# Patient Record
Sex: Male | Born: 2000 | Hispanic: Yes | Marital: Single | Smoking: Never smoker
Health system: Southern US, Community
[De-identification: ages and names within clinical notes are randomized; demographics above are authoritative.]

---
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
## 2020-07-07 DIAGNOSIS — S00251A Superficial foreign body of right eyelid and periocular area, initial encounter: Secondary | ICD-10-CM | POA: Diagnosis not present

## 2021-03-27 ENCOUNTER — Other Ambulatory Visit: Payer: Self-pay | Admitting: Physician Assistant

## 2021-10-31 ENCOUNTER — Other Ambulatory Visit: Payer: Self-pay

## 2021-10-31 NOTE — Telephone Encounter (Signed)
Entered in error

## 2024-03-19 ENCOUNTER — Encounter: Payer: Self-pay | Admitting: Nurse Practitioner

## 2024-03-19 ENCOUNTER — Ambulatory Visit: Admitting: Nurse Practitioner

## 2024-03-19 ENCOUNTER — Ambulatory Visit: Payer: Self-pay | Admitting: Nurse Practitioner

## 2024-03-19 VITALS — BP 130/76 | HR 81 | Temp 97.5°F | Resp 16 | Ht 66.0 in | Wt 146.2 lb

## 2024-03-19 DIAGNOSIS — Z833 Family history of diabetes mellitus: Secondary | ICD-10-CM | POA: Diagnosis not present

## 2024-03-19 DIAGNOSIS — R040 Epistaxis: Secondary | ICD-10-CM | POA: Diagnosis not present

## 2024-03-19 DIAGNOSIS — E782 Mixed hyperlipidemia: Secondary | ICD-10-CM | POA: Diagnosis not present

## 2024-03-19 DIAGNOSIS — R55 Syncope and collapse: Secondary | ICD-10-CM

## 2024-03-19 DIAGNOSIS — Z113 Encounter for screening for infections with a predominantly sexual mode of transmission: Secondary | ICD-10-CM

## 2024-03-19 NOTE — Progress Notes (Signed)
 [facility] [address] [place], [place] [address]  Internal MEDICINE  Office Visit Note  Patient Name: Curtis Kent  [id number]  [id number]  Date of Service: 03/19/2024   Complaints/HPI Pt is here for establishment of PCP. Chief Complaint  Patient presents with   New Patient (Initial Visit)    Est care, nose bleeds    HPI Curtis Kent presents for a new patient visit to establish care.  Well-appearing 23 y.o. male with no significant medical problems.  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 He has had 1 nosebleed each week for the past 2 weeks. Reports that the bleeding is heavy for a couple of minutes and he feels lightheaded when this happens. Reports that he did have a concussion about 15 years ago as a child. Happens only from the right nare Possible syncopal episodes, confusion, has happened twice.  Family history of diabetes -- father and uncle.    Current Medication: No outpatient encounter medications on file as of 03/19/2024.   No facility-administered encounter medications on file as of 03/19/2024.    Surgical History: History reviewed. No pertinent surgical history.  Medical History: History reviewed. No pertinent past medical history.  Family History: History reviewed. No pertinent family history.  Social History   Socioeconomic History   Marital status: Single    Spouse name: Not on file   Number of children: Not on file   Years of education: Not on file   Highest education level: Not on file  Occupational History   Not on file  Tobacco Use   Smoking status: Some Days    Types: Cigarettes   Smokeless tobacco: Never  Substance and  Sexual Activity   Alcohol use: Yes    Comment: occ.   Drug use: Yes    Types: Marijuana   Sexual activity: Yes  Other Topics Concern   Not on file  Social History Narrative   Not on file   Social Drivers of Health   Financial Resource Strain: Not on file  Food Insecurity: Not on file  Transportation Needs: Not on file  Physical Activity: Not on file  Stress: Not on file  Social Connections: Not on file  Intimate Partner Violence: Not on file     Review of Systems  Constitutional:  Negative for chills, fatigue and unexpected weight change.  HENT:  Positive for nosebleeds. Negative for congestion, postnasal drip, rhinorrhea, sneezing and sore throat.   Eyes:  Negative for redness.  Respiratory: Negative.  Negative for cough, chest tightness, shortness of breath and wheezing.   Cardiovascular: Negative.  Negative for chest pain and palpitations.  Gastrointestinal: Negative.  Negative for abdominal pain, constipation, diarrhea, nausea and vomiting.  Genitourinary:  Negative for dysuria and frequency.  Musculoskeletal:  Negative for arthralgias, back pain, joint swelling and neck pain.  Skin:  Negative for rash.  Neurological:  Positive for syncope (presycope? x2 episodes). Negative for tremors and numbness.  Hematological:  Negative for adenopathy. Does not bruise/bleed easily.  Psychiatric/Behavioral:  Negative for behavioral problems (Depression), sleep disturbance and suicidal ideas. The patient is not nervous/anxious.     Vital Signs: BP 130/76   Pulse  81   Temp (!) 97.5 F (36.4 C)   Resp 16   Ht 5' 6 (1.676 m)   Wt 146 lb 3.2 oz (66.3 kg)   SpO2 99%   BMI 23.60 kg/m    Physical Exam Vitals reviewed.  Constitutional:      General: He is not in acute distress.    Appearance: Normal appearance. He is normal weight. He is not ill-appearing.  HENT:     Head: Normocephalic and atraumatic.  Eyes:     Pupils: Pupils are equal, round, and reactive to light.   Cardiovascular:     Rate and Rhythm: Normal rate and regular rhythm.  Pulmonary:     Effort: Pulmonary effort is normal. No respiratory distress.  Skin:    General: Skin is warm and dry.  Neurological:     Mental Status: He is alert and oriented to person, place, and time.  Psychiatric:        Mood and Affect: Mood normal.        Behavior: Behavior normal.       Assessment/Plan: 1. Recurrent epistaxis (Primary) Referred to ENT - Ambulatory referral to ENT  2. Pre-syncope Routine labs ordered  - CBC with Differential/Platelet - CMP14+EGFR - Lipid Profile - Hgb A1C w/o eAG  3. Mixed hyperlipidemia Routine labs ordered  - CBC with Differential/Platelet - CMP14+EGFR - Lipid Profile - Hgb A1C w/o eAG  4. Family history of type 2 diabetes mellitus Routine labs ordered  - CBC with Differential/Platelet - CMP14+EGFR - Lipid Profile - Hgb A1C w/o eAG  5. Screening for STD (sexually transmitted disease) Labs ordered  - STI Profile - Interpretation:    General Counseling: Curtis Kent verbalizes understanding of the findings of todays visit and agrees with plan of treatment. I have discussed any further diagnostic evaluation that may be needed or ordered today. We also reviewed his medications today. he has been encouraged to call the office with any questions or concerns that should arise related to todays visit.    Orders Placed This Encounter  Procedures   CBC with Differential/Platelet   CMP14+EGFR   Lipid Profile   Hgb A1C w/o eAG   STI Profile   Interpretation:   Ambulatory referral to ENT    No orders of the defined types were placed in this encounter.   Return for CPE, Suraiya Dickerson PCP at earliest available opening, please have labs done before next visit. .  Time spent:30 Minutes Time spent with patient included reviewing progress notes, labs, imaging studies, and discussing plan for follow up.   [place] Controlled Substance Database was reviewed by me for overdose  risk score (ORS)   This patient was seen by Mardy Maxin, FNP-C in collaboration with Dr. Sigrid Bathe as a part of collaborative care agreement.   Willi Borowiak R. Maxin, MSN, FNP-C Internal Medicine

## 2024-03-23 ENCOUNTER — Telehealth: Payer: Self-pay | Admitting: Nurse Practitioner

## 2024-03-23 NOTE — Telephone Encounter (Signed)
 Awaiting 03/19/24 office notes for Otolaryngology referral-Curtis Kent

## 2024-03-24 LAB — CBC WITH DIFFERENTIAL/PLATELET
Basophils Absolute: 0.1 x10E3/uL (ref 0.0–0.2)
Basos: 1 %
EOS (ABSOLUTE): 0.2 x10E3/uL (ref 0.0–0.4)
Eos: 3 %
Hematocrit: 47.1 % (ref 37.5–51.0)
Hemoglobin: 16.2 g/dL (ref 13.0–17.7)
Immature Grans (Abs): 0 x10E3/uL (ref 0.0–0.1)
Immature Granulocytes: 0 %
Lymphocytes Absolute: 1.9 x10E3/uL (ref 0.7–3.1)
Lymphs: 30 %
MCH: 31 pg (ref 26.6–33.0)
MCHC: 34.4 g/dL (ref 31.5–35.7)
MCV: 90 fL (ref 79–97)
Monocytes Absolute: 0.4 x10E3/uL (ref 0.1–0.9)
Monocytes: 6 %
Neutrophils Absolute: 3.8 x10E3/uL (ref 1.4–7.0)
Neutrophils: 60 %
Platelets: 313 x10E3/uL (ref 150–450)
RBC: 5.22 x10E6/uL (ref 4.14–5.80)
RDW: 12 % (ref 11.6–15.4)
WBC: 6.3 x10E3/uL (ref 3.4–10.8)

## 2024-03-24 LAB — LIPID PANEL
Chol/HDL Ratio: 2.9 ratio (ref 0.0–5.0)
Cholesterol, Total: 184 mg/dL (ref 100–199)
HDL: 63 mg/dL (ref >39)
LDL Chol Calc (NIH): 102 mg/dL — ABNORMAL HIGH (ref 0–99)
Triglycerides: 105 mg/dL (ref 0–149)
VLDL Cholesterol Cal: 19 mg/dL (ref 5–40)

## 2024-03-24 LAB — CMP14+EGFR
ALT: 14 IU/L (ref 0–44)
AST: 21 IU/L (ref 0–40)
Albumin: 4.9 g/dL (ref 4.3–5.2)
Alkaline Phosphatase: 99 IU/L (ref 47–123)
BUN/Creatinine Ratio: 15 (ref 9–20)
BUN: 12 mg/dL (ref 6–20)
Bilirubin Total: 0.6 mg/dL (ref 0.0–1.2)
CO2: 24 mmol/L (ref 20–29)
Calcium: 9.6 mg/dL (ref 8.7–10.2)
Chloride: 101 mmol/L (ref 96–106)
Creatinine, Ser: 0.82 mg/dL (ref 0.76–1.27)
Globulin, Total: 2.9 g/dL (ref 1.5–4.5)
Glucose: 86 mg/dL (ref 70–99)
Potassium: 4.5 mmol/L (ref 3.5–5.2)
Sodium: 139 mmol/L (ref 134–144)
Total Protein: 7.8 g/dL (ref 6.0–8.5)
eGFR: 127 mL/min/1.73 (ref >59)

## 2024-03-24 LAB — STI PROFILE
HCV Ab: NONREACTIVE
HIV Screen 4th Generation wRfx: NONREACTIVE
Hep B Core Total Ab: NEGATIVE
Hep B Surface Ab, Qual: NONREACTIVE
Hepatitis B Surface Ag: NEGATIVE
RPR Ser Ql: NONREACTIVE

## 2024-03-24 LAB — HGB A1C W/O EAG: Hgb A1c MFr Bld: 5.2 % (ref 4.8–5.6)

## 2024-03-24 LAB — HCV INTERPRETATION

## 2024-03-31 ENCOUNTER — Ambulatory Visit: Admitting: Nurse Practitioner

## 2024-03-31 ENCOUNTER — Ambulatory Visit: Payer: Self-pay | Admitting: Nurse Practitioner

## 2024-03-31 ENCOUNTER — Encounter: Payer: Self-pay | Admitting: Nurse Practitioner

## 2024-03-31 VITALS — BP 120/84 | HR 90 | Temp 97.5°F | Resp 16 | Ht 66.0 in | Wt 148.2 lb

## 2024-03-31 DIAGNOSIS — E7800 Pure hypercholesterolemia, unspecified: Secondary | ICD-10-CM | POA: Diagnosis not present

## 2024-03-31 DIAGNOSIS — Z0001 Encounter for general adult medical examination with abnormal findings: Secondary | ICD-10-CM

## 2024-03-31 DIAGNOSIS — R040 Epistaxis: Secondary | ICD-10-CM | POA: Diagnosis not present

## 2024-03-31 NOTE — Progress Notes (Signed)
 [facility] [address] [place], [place] [address]  Internal MEDICINE  Office Visit Note  Patient Name: Curtis Kent  [id number]  [id number]  Date of Service: 03/31/2024  Chief Complaint  Patient presents with   Annual Exam    HPI Curtis Kent presents for an annual well visit and physical exam.  Well-appearing 23 y.o. male with no significant medical problems and is not currently on any prescription medications  Labs: routine labs done, results discussed today.  New or worsening pain: none  Other concerns: none  Slightly elevated LDL 102, the rest of the cholesterol panel is normal.  CBC, CMP and A1c are normal  STD lab was negative -- negative for HIV, hep B, hep C and syphilis. His Hep B surface antibody is nonreactive which means he is also not immune to hepatitis B virus.    Current Medication: No outpatient encounter medications on file as of 03/31/2024.   No facility-administered encounter medications on file as of 03/31/2024.    Surgical History: History reviewed. No pertinent surgical history.  Medical History: History reviewed. No pertinent past medical history.  Family History: History reviewed. No pertinent family history.  Social History   Socioeconomic History   Marital status: Single    Spouse name: Not on file   Number of children: Not on file   Years of education: Not on file   Highest education level: Not on file  Occupational History   Not on file  Tobacco Use   Smoking status: Some Days    Types: Cigarettes   Smokeless tobacco: Never  Substance and Sexual Activity   Alcohol use: Yes    Comment: occ.   Drug use: Yes    Types: Marijuana   Sexual activity: Yes  Other Topics Concern   Not on file  Social History Narrative   Not on file   Social Drivers of Health   Financial Resource Strain: Not on file  Food Insecurity: Not on file  Transportation Needs: Not on file  Physical Activity: Not on file  Stress: Not on file   Social Connections: Not on file  Intimate Partner Violence: Not on file      Review of Systems  Constitutional:  Negative for chills, fatigue and unexpected weight change.  HENT:  Positive for nosebleeds. Negative for congestion, postnasal drip, rhinorrhea, sinus pressure, sinus pain, sneezing and sore throat.   Eyes:  Negative for redness.  Respiratory: Negative.  Negative for cough, chest tightness, shortness of breath and wheezing.   Cardiovascular: Negative.  Negative for chest pain and palpitations.  Gastrointestinal:  Negative for abdominal pain, constipation, diarrhea, nausea and vomiting.  Genitourinary:  Negative for dysuria and frequency.  Musculoskeletal:  Negative for arthralgias, back pain, joint swelling and neck pain.  Skin:  Negative for rash.  Neurological: Negative.  Negative for tremors and numbness.  Hematological:  Negative for adenopathy. Does not bruise/bleed easily.  Psychiatric/Behavioral:  Negative for behavioral problems (Depression), sleep disturbance and suicidal ideas. The patient is not nervous/anxious.     Vital Signs: BP 120/84   Pulse 90   Temp (!) 97.5 F (36.4 C)   Resp 16   Ht 5' 6 (1.676 m)   Wt 148 lb 3.2 oz (67.2 kg)   SpO2 99%   BMI 23.92 kg/m    Physical Exam Vitals reviewed.  Constitutional:      General: He is not in acute distress.    Appearance: Normal appearance. He is well-developed and normal weight.  He is not ill-appearing or diaphoretic.  HENT:     Head: Normocephalic and atraumatic.     Right Ear: Tympanic membrane, ear canal and external ear normal.     Left Ear: Tympanic membrane, ear canal and external ear normal.     Nose: No congestion or rhinorrhea.     Right Nostril: Epistaxis present.     Right Turbinates: Enlarged and swollen.     Mouth/Throat:     Mouth: Mucous membranes are moist.     Pharynx: Oropharynx is clear. No oropharyngeal exudate or posterior oropharyngeal erythema.  Eyes:     Extraocular  Movements: Extraocular movements intact.     Conjunctiva/sclera: Conjunctivae normal.     Pupils: Pupils are equal, round, and reactive to light.  Neck:     Thyroid: No thyromegaly.     Vascular: No JVD.     Trachea: No tracheal deviation.  Cardiovascular:     Rate and Rhythm: Normal rate and regular rhythm.     Pulses: Normal pulses.     Heart sounds: Normal heart sounds. No murmur heard.    No friction rub. No gallop.  Pulmonary:     Effort: Pulmonary effort is normal. No respiratory distress.     Breath sounds: Normal breath sounds. No wheezing or rales.  Chest:     Chest wall: No tenderness.  Abdominal:     General: Bowel sounds are normal.     Palpations: Abdomen is soft.  Musculoskeletal:        General: Normal range of motion.     Cervical back: Normal range of motion and neck supple.     Right lower leg: No edema.     Left lower leg: No edema.  Lymphadenopathy:     Cervical: No cervical adenopathy.  Skin:    General: Skin is warm and dry.     Capillary Refill: Capillary refill takes less than 2 seconds.  Neurological:     Mental Status: He is alert and oriented to person, place, and time.     Cranial Nerves: No cranial nerve deficit.     Coordination: Coordination normal.     Gait: Gait normal.  Psychiatric:        Mood and Affect: Mood normal.        Behavior: Behavior normal.        Thought Content: Thought content normal.        Judgment: Judgment normal.        Assessment/Plan: 1. Encounter for routine adult health examination with abnormal findings (Primary) Age-appropriate preventive screenings and vaccinations discussed, annual physical exam completed. Routine labs for health maintenance results discussed with the patient today. PHM updated.    2. Recurrent epistaxis Waiting for ENT office to call to schedule appt. Has not had any more nosebleeds  3. Elevated LDL cholesterol level Discussed eating more lean proteins and continue regular physical  activity.      General Counseling: Curtis Kent understanding of the findings of todays visit and agrees with plan of treatment. I have discussed any further diagnostic evaluation that may be needed or ordered today. We also reviewed his medications today. he has been encouraged to call the office with any questions or concerns that should arise related to todays visit.    No orders of the defined types were placed in this encounter.   No orders of the defined types were placed in this encounter.   Return in about 1 year (around 03/31/2025) for CPE, Laqueena Hinchey  PCP and otherwise as needed. .   Total time spent:30 Minutes Time spent includes review of chart, medications, test results, and follow up plan with the patient.   [place] Controlled Substance Database was reviewed by me.  This patient was seen by Mardy Maxin, FNP-C in collaboration with Dr. Sigrid Bathe as a part of collaborative care agreement.  Gradyn Shein R. Maxin, MSN, FNP-C Internal medicine

## 2024-04-01 ENCOUNTER — Encounter: Payer: Self-pay | Admitting: Nurse Practitioner

## 2024-04-07 ENCOUNTER — Telehealth: Payer: Self-pay | Admitting: Nurse Practitioner

## 2024-04-07 NOTE — Telephone Encounter (Signed)
 Otolaryngology referral sent via [facility] to [facility]. Notified patient. Gave patient telephone # [phone]

## 2025-04-05 ENCOUNTER — Encounter: Admitting: Nurse Practitioner
